# Patient Record
Sex: Male | Born: 1980 | Race: Black or African American | Hispanic: No | Marital: Single | Smoking: Former smoker
Health system: Southern US, Community
[De-identification: ages and names within clinical notes are randomized; demographics above are authoritative.]

## PROBLEM LIST (undated history)

## (undated) DIAGNOSIS — L0291 Cutaneous abscess, unspecified: Secondary | ICD-10-CM

## (undated) NOTE — ED Notes (Signed)
Formatting of this note might be different from the original.  Pt presents with abscess on R jawline and pain to area.  Pain score 5/10.  Sts Hx of cysts from ingrown hairs.    Electronically signed by Sondra Barges, RN at 02/25/2013 11:38 AM EDT

## (undated) NOTE — ED Notes (Signed)
Formatting of this note might be different from the original.  PA at bedside.  Electronically signed by Sondra Barges, RN at 02/25/2013 12:30 PM EDT

## (undated) NOTE — ED Provider Notes (Signed)
Formatting of this note might be different from the original.  Medical screening examination/treatment/procedure(s) were performed by non-physician practitioner and as supervising physician I was immediately available for consultation/collaboration.    Malvin Johns, MD  03/04/13 2337  Electronically signed by Malvin Johns, MD at 03/04/2013 11:37 PM EDT

## (undated) NOTE — ED Notes (Signed)
Formatting of this note might be different from the original.  Pt escorted to discharge window. Verbalized understanding discharge instructions. In no acute distress.     Electronically signed by Sondra Barges, RN at 02/25/2013 12:51 PM EDT

## (undated) NOTE — ED Provider Notes (Signed)
Formatting of this note is different from the original.  Images from the original note were not included.  History       CSN: [id number]    Arrival date & time 02/25/13  1120     First MD Initiated Contact with Patient 02/25/13 1138        Chief Complaint   Patient presents with   ? Abscess     (Consider location/radiation/quality/duration/timing/severity/associated sxs/prior  treatment)  HPI    Patient is a 65 year old male past medical history significant for skin abscesses presenting for right-sided facial abscess that he first noticed a day or 2 ago. Patient states he recently had his hair cut prior to abscess formation. Patient states in past this has been provoking factor for abscess development. Denies any surrounding erythema, fever, chills, drainage from site. No other physical complaints.    Past Medical History   Diagnosis Date   ? Abscess      History reviewed. No pertinent past surgical history.    History reviewed. No pertinent family history.    History   Substance Use Topics   ? Smoking status: Former Smoker   ? Smokeless tobacco: Not on file   ? Alcohol Use: Yes     Review of Systems   Constitutional: Negative for fever and chills.   HENT: Positive for facial swelling. Negative for neck pain.    Eyes: Negative for visual disturbance.   Respiratory: Negative for shortness of breath.    Cardiovascular: Negative for chest pain.   Skin:        Abscess     Allergies   Review of patient's allergies indicates no known allergies.    Home Medications     Current Outpatient Rx   Name  Route  Sig  Dispense  Refill   ? Multiple Vitamins-Minerals (MULTIVITAMINS THER. W/MINERALS) TABS    Oral    Take 1 tablet by mouth every morning.           BP 133/69  Pulse 80  Temp(Src) 98.9 F (37.2 C) (Oral)  Resp 16  SpO2 100%    Physical Exam   Constitutional: He is oriented to person, place, and time. He appears well-developed and well-nourished. No distress.   HENT:   Head: Normocephalic and atraumatic.     3 cm  fluctuant nonindurated abscess noted on right side of face. No surrounding erythema or warmth.   Eyes: Conjunctivae are normal.   Neck: Neck supple.   Neurological: He is alert and oriented to person, place, and time.   Skin: Skin is warm and dry. He is not diaphoretic.   Psychiatric: He has a normal mood and affect.     ED Course   Procedures (including critical care time)    INCISION AND DRAINAGE  Performed by: Baron Sane L  Consent: Verbal consent obtained.  Risks and benefits: risks, benefits and alternatives were discussed  Type: abscess    Body area: right side of face    Anesthesia: local infiltration    Incision was made with a scalpel.    Local anesthetic: lidocaine 1% w/o epinephrine    Anesthetic total: 3 ml    Complexity: complex  Blunt dissection to break up loculations    Drainage: purulent    Drainage amount: copious    Packing material: 1/4 in iodoform gauze    Patient tolerance: Patient tolerated the procedure well with no immediate complications.    Labs Reviewed - No data to display  No  results found.    1. Abscess      MDM   Patient with skin abscess amenable to incision and drainage.  Abscess was packed and drain,  wound recheck in 2 days. Encouraged home warm soaks and flushing.  Mild signs of cellulitis is surrounding skin.  Will d/c to home.  No antibiotic therapy is indicated.    Harlow Mares, PA-C  02/25/13 1605  Electronically signed by Malvin Johns, MD at 03/04/2013 11:37 PM EDT

---

## 2003-07-13 ENCOUNTER — Emergency Department (HOSPITAL_COMMUNITY): Admission: EM | Admit: 2003-07-13 | Discharge: 2003-07-14 | Payer: Self-pay | Admitting: Emergency Medicine

## 2005-02-26 ENCOUNTER — Emergency Department (HOSPITAL_COMMUNITY): Admission: EM | Admit: 2005-02-26 | Discharge: 2005-02-26 | Payer: Self-pay | Admitting: *Deleted

## 2005-10-12 IMAGING — CT CT ORBIT/TEMPORAL/IAC W/ CM
2 of 3 series · 15 of 40 positions shown, 18 images · IV contrast (100 ML OMNI 300)
Comparison: None.

CLINICAL DATA: Red, swollen rt eye for two weeks; no trauma
CT ORBITS WITH CONTRAST:
100 cc Omnipaque 300 IV.

[Series 102: orbits with contrast · axial · 0.34mm/px · z∈[-140,-55]mm · 12 of 151 slices shown, 15 images]
[im 7/151  brain]
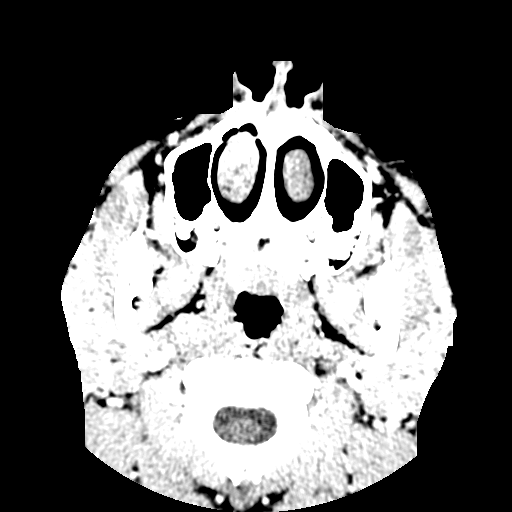
[im 7/151  bone]
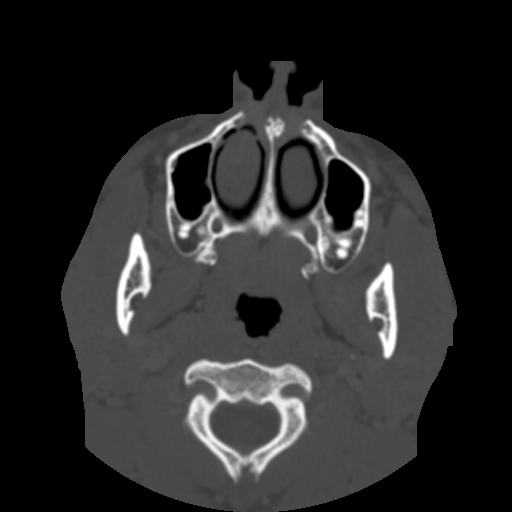
[im 20/151  bone]
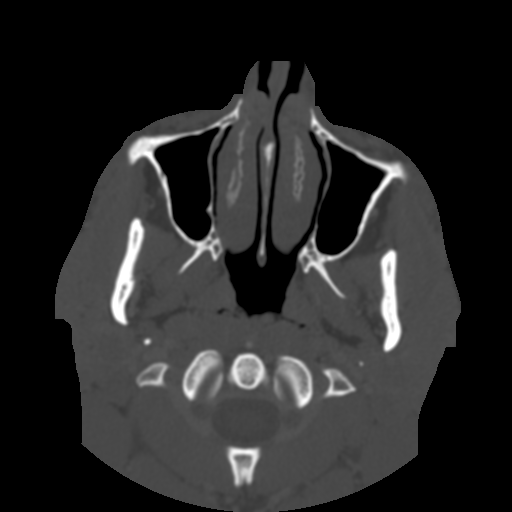
[im 33/151  bone]
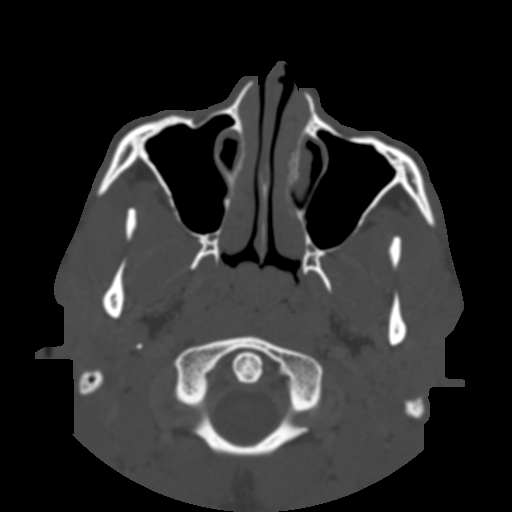
[im 46/151  bone]
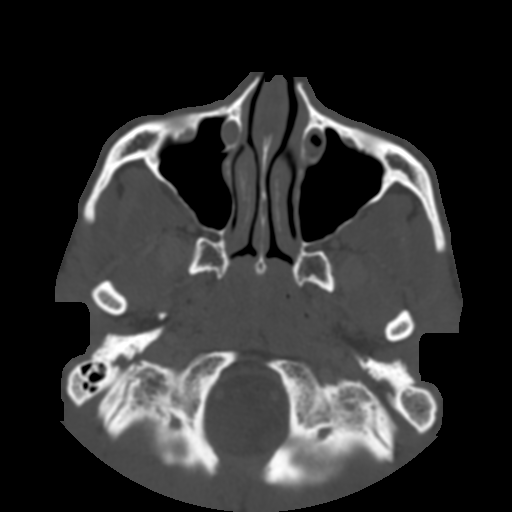
[im 59/151  brain]
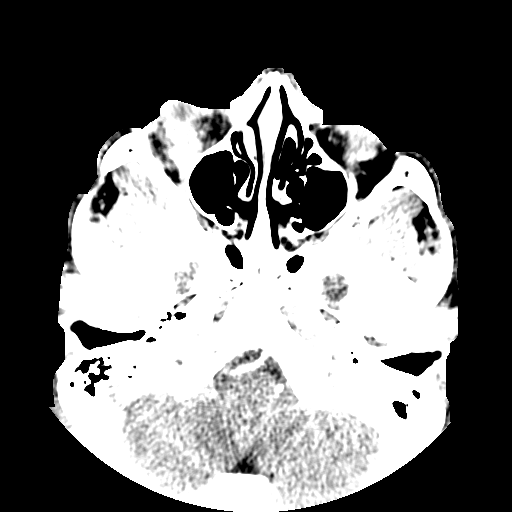
[im 59/151  bone]
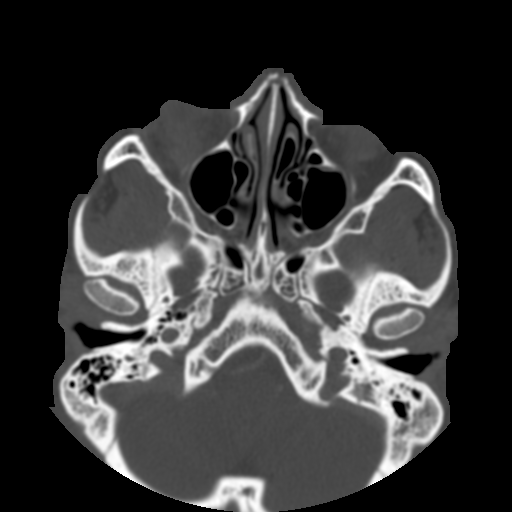
[im 72/151  bone]
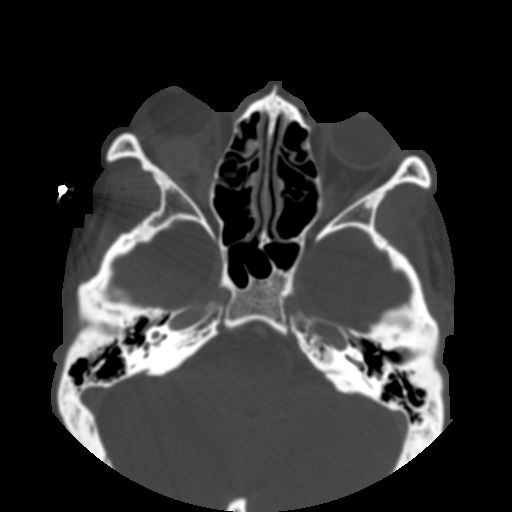
[im 79/151  bone]
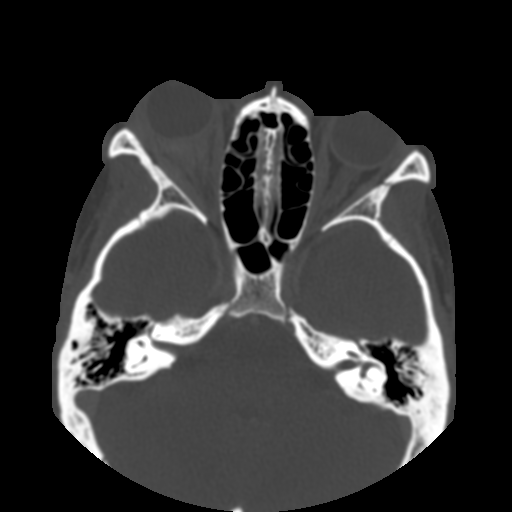
[im 92/151  bone]
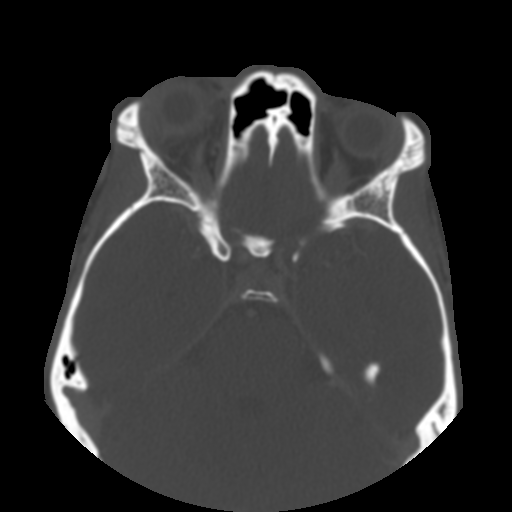
[im 105/151  brain]
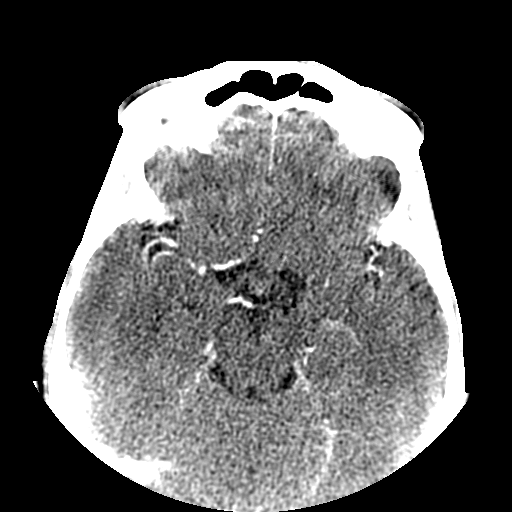
[im 105/151  bone]
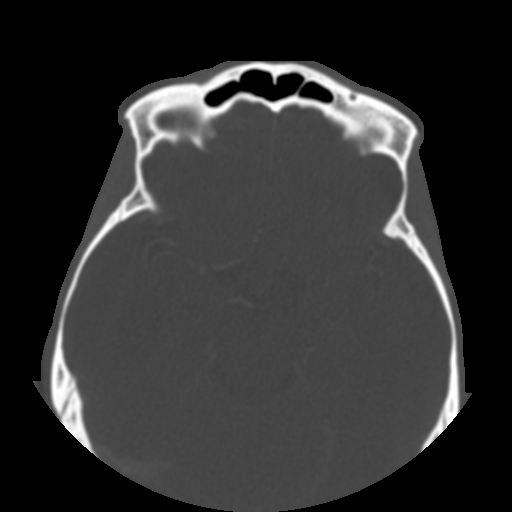
[im 118/151  bone]
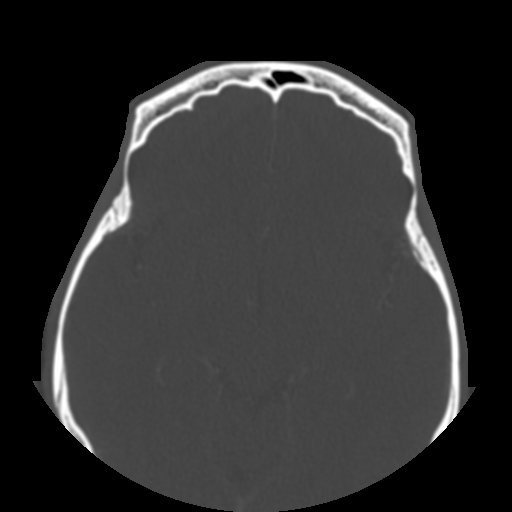
[im 131/151  bone]
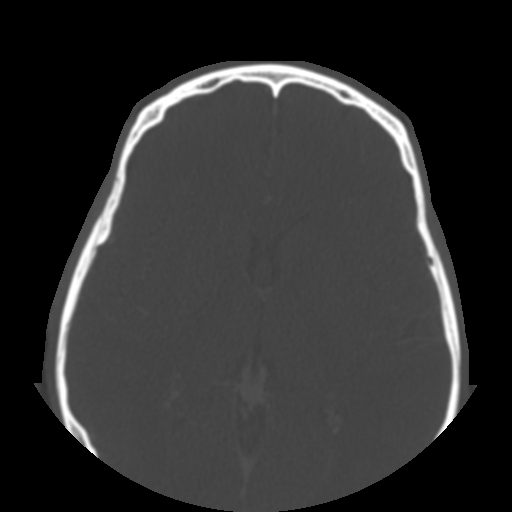
[im 144/151  bone]
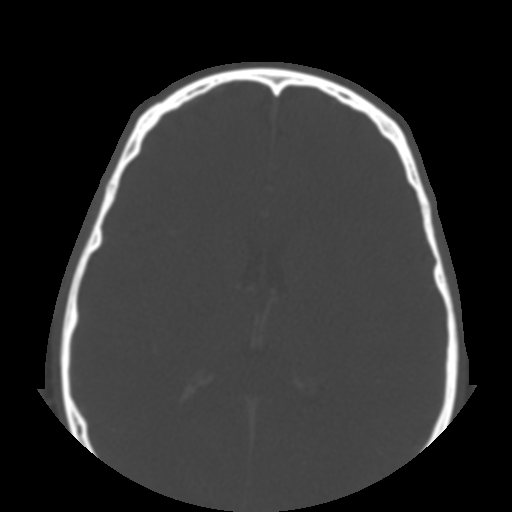

[Series 103: reformatted · coronal · 0.34mm/px · 3 of 55 slices shown]
[im 19/55  bone]
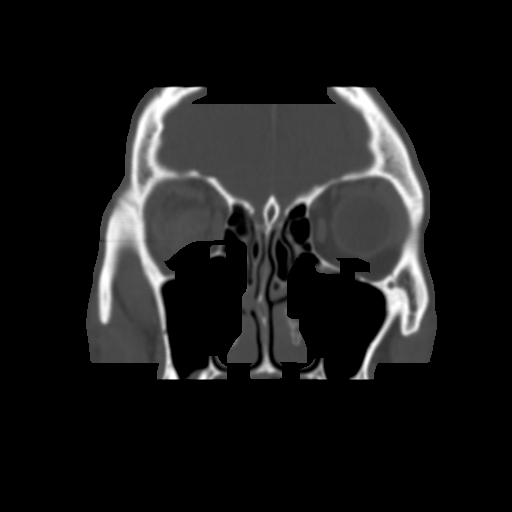
[im 25/55  bone]
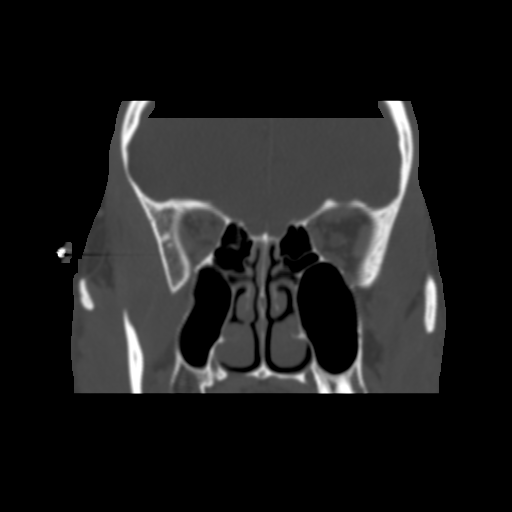
[im 31/55  bone]
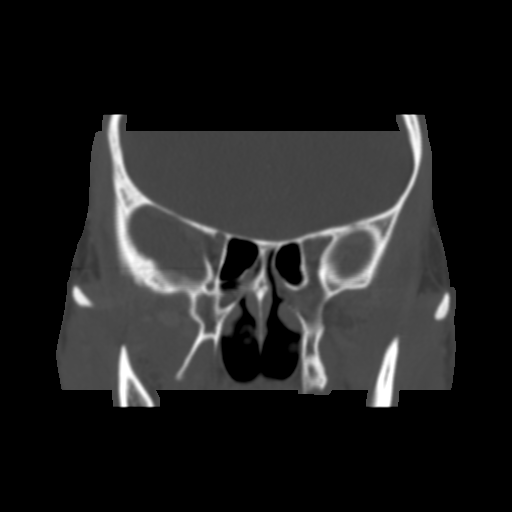

[15 of 40 positions shown; findings below may reference images not displayed]

There is moderate proptosis of the right eye.  There is an enhancing mass behind the right eye.  The mass has infiltrative and ill-defined margins and measures approximately 9 mm.  It idisplaces in the optic nerve medially.  The mass appears to be flattening the posterior sclera of the right eye, but does not appear to be invading the vitreous.  There is enhancement and thickening of tissues lateral to the right eye involving the lacrimal gland.  The right lateral rectus is enhancing and enlarged.  The remainder of the extraocular muscle proximally is normal in size.  No fluid collection is seen.  The eyelid is moderately swollen on the right.  Bones are normal and the paranasal sinuses are clear.  There is no evidence of sinusitis.  The left eye is normal.
IMPRESSION: Enhancing intraconal mass behind the right eye with some flattening of the posterior choroid.  There is infiltration into the intraconal fat and also infiltration of the lacrimal gland and right lateral rectus muscle.  While it is possible this is an area of orbital infection, there is no fluid collection and I think this is more likely a tumor.  Possibilities would include melanoma, lymphoma, metastatic disease.  Sarcoid would also be a possibility. 
The results were telephoned to Dr. Jg at 3199 hours on 02/26/05.

## 2010-06-08 ENCOUNTER — Emergency Department (HOSPITAL_COMMUNITY): Admission: EM | Admit: 2010-06-08 | Discharge: 2010-06-08 | Payer: Self-pay | Admitting: Emergency Medicine

## 2010-08-16 ENCOUNTER — Emergency Department (HOSPITAL_COMMUNITY): Admission: EM | Admit: 2010-08-16 | Discharge: 2010-08-16 | Payer: Self-pay | Admitting: Emergency Medicine

## 2011-08-23 ENCOUNTER — Emergency Department (HOSPITAL_COMMUNITY)
Admission: EM | Admit: 2011-08-23 | Discharge: 2011-08-23 | Disposition: A | Discharge status: Home / Self Care | Payer: BC Managed Care – PPO | Attending: Emergency Medicine | Admitting: Emergency Medicine

## 2011-08-23 DIAGNOSIS — R220 Localized swelling, mass and lump, head: Secondary | ICD-10-CM | POA: Insufficient documentation

## 2011-08-23 DIAGNOSIS — L0291 Cutaneous abscess, unspecified: Secondary | ICD-10-CM | POA: Insufficient documentation

## 2011-08-23 DIAGNOSIS — L0390 Cellulitis, unspecified: Secondary | ICD-10-CM | POA: Insufficient documentation

## 2011-08-23 HISTORY — DX: Cutaneous abscess, unspecified: L02.91

## 2011-08-23 MED ORDER — HYDROCODONE-ACETAMINOPHEN 5-325 MG PO TABS: 1.0000 | ORAL_TABLET | Freq: Four times a day (QID) | ORAL | Status: AC | PRN

## 2011-08-23 MED ORDER — CLINDAMYCIN HCL 150 MG PO CAPS: 300.0000 mg | ORAL_CAPSULE | Freq: Three times a day (TID) | ORAL | Status: AC

## 2011-08-23 MED ORDER — OXYCODONE-ACETAMINOPHEN 5-325 MG PO TABS
2.0000 | ORAL_TABLET | Freq: Once | ORAL | Status: DC
  Filled 2011-08-23: qty 2

## 2011-08-23 NOTE — ED Notes (Signed)
Abscess to rt side of face- Hx of the same

## 2011-08-23 NOTE — Discharge Instructions (Signed)
Follow up with an ENT or oral surgeon in 1 week. Take antibiotics as directed.   Abscess An abscess (boil or furuncle) is an infected area that contains a collection of pus.  SYMPTOMS Signs and symptoms of an abscess include pain, tenderness, redness, or hardness. You may feel a moveable soft area under your skin. An abscess can occur anywhere in the body.  TREATMENT  A surgical cut (incision) may be made over your abscess to drain the pus. Gauze may be packed into the space or a drain may be looped through the abscess cavity (pocket). This provides a drain that will allow the cavity to heal from the inside outwards. The abscess may be painful for a few days, but should feel much better if it was drained.  Your abscess, if seen early, may not have localized and may not have been drained. If not, another appointment may be required if it does not get better on its own or with medications. HOME CARE INSTRUCTIONS   Only take over-the-counter or prescription medicines for pain, discomfort, or fever as directed by your caregiver.   Take your antibiotics as directed if they were prescribed. Finish them even if you start to feel better.   Keep the skin and clothes clean around your abscess.   If the abscess was drained, you will need to use gauze dressing to collect any draining pus. Dressings will typically need to be changed 3 or more times a day.   The infection may spread by skin contact with others. Avoid skin contact as much as possible.   Practice good hygiene. This includes regular hand washing, cover any draining skin lesions, and do not share personal care items.   If you participate in sports, do not share athletic equipment, towels, whirlpools, or personal care items. Shower after every practice or tournament.   If a draining area cannot be adequately covered:   Do not participate in sports.   Children should not participate in day care until the wound has healed or drainage stops.     If your caregiver has given you a follow-up appointment, it is very important to keep that appointment. Not keeping the appointment could result in a much worse infection, chronic or permanent injury, pain, and disability. If there is any problem keeping the appointment, you must call back to this facility for assistance.  SEEK MEDICAL CARE IF:   You develop increased pain, swelling, redness, drainage, or bleeding in the wound site.   You develop signs of generalized infection including muscle aches, chills, fever, or a general ill feeling.   You have an oral temperature above 102 F (38.9 C).  MAKE SURE YOU:   Understand these instructions.   Will watch your condition.   Will get help right away if you are not doing well or get worse.  Document Released: 06/29/2005 Document Revised: 06/01/2011 Document Reviewed: 04/22/2008 [facility] Patient Information 2012 [place], [place].

## 2011-08-23 NOTE — ED Provider Notes (Signed)
History     CSN: [id number] Arrival date & time: 08/23/2011 10:20 AM   First MD Initiated Contact with Patient 08/23/11 1046      Chief Complaint  Patient presents with  . Abscess   Patient is a 30 y.o. male presenting with abscess.  Abscess  This is a recurrent problem. The abscess is present on the face. The abscess is characterized by painfulness. Pertinent negatives include no fever, no vomiting, no rhinorrhea, no sore throat and no cough.   Patient presents to the emergency room with complaint of abscess to the right face. Reports that was seen for this problem 6 months prior. States that he plans to see a Careers adviser for this issue.  Past Medical History  Diagnosis Date  . Abscess     History reviewed. No pertinent past surgical history.  History reviewed. No pertinent family history.  History  Substance Use Topics  . Smoking status: Current Everyday Smoker  . Smokeless tobacco: Not on file  . Alcohol Use: Yes      Review of Systems  Constitutional: Negative for fever, chills, diaphoresis and appetite change.  HENT: Negative for sore throat, rhinorrhea, neck pain, neck stiffness and tinnitus.   Eyes: Negative for photophobia and visual disturbance.  Respiratory: Negative for cough, chest tightness and shortness of breath.   Cardiovascular: Negative for chest pain.  Gastrointestinal: Negative for nausea, vomiting and abdominal pain.  Genitourinary: Negative for flank pain.  Musculoskeletal: Negative for back pain.  Skin: Negative for rash.  Neurological: Negative for weakness and numbness.  All other systems reviewed and are negative.    Allergies  Review of patient's allergies indicates no known allergies.  Home Medications   Current Outpatient Rx  Name Route Sig Dispense Refill  . IBUPROFEN 200 MG PO TABS Oral Take 200 mg by mouth every 6 (six) hours as needed. pain     . THERA M PLUS PO TABS Oral Take 1 tablet by mouth daily.        BP 143/85  Pulse  64  Temp(Src) 98.4 F (36.9 C) (Oral)  Resp 20  Wt 165 lb (74.844 kg)  SpO2 100%  Physical Exam  Vitals reviewed. Constitutional: He is oriented to person, place, and time. He appears well-developed and well-nourished.  HENT:  Head: Normocephalic and atraumatic.  Right Ear: External ear normal.  Mouth/Throat: Oropharynx is clear and moist.       Fluctuant mass 2 x 2 cm fluctuant mass, superficial, near angle of the right mandible. No overlying erythema or warmth. No purulent discharge.   Eyes: EOM are normal. Pupils are equal, round, and reactive to light. Right eye exhibits no discharge. Left eye exhibits no discharge.  Neck: Normal range of motion. Neck supple.  Cardiovascular: Normal rate and regular rhythm.   Pulmonary/Chest: Effort normal and breath sounds normal.  Musculoskeletal: He exhibits no edema and no tenderness.  Neurological: He is alert and oriented to person, place, and time. He displays normal reflexes. No cranial nerve deficit. He exhibits normal muscle tone. Coordination normal.  Skin: Skin is warm and dry. No rash noted. No erythema. No pallor.  Psychiatric: He has a normal mood and affect. His behavior is normal. Judgment and thought content normal.    ED Course  Procedures (including critical care time)  INCISION AND DRAINAGE Performed by: Rochel Brome A Consent: Verbal consent obtained. Risks and benefits: risks, benefits and alternatives were discussed Type: abscess  Body area:  Right face  Anesthesia: local infiltration  Local anesthetic: lidocaine 2% without epinephrine  Anesthetic total: 4 ml  Complexity: complex Blunt dissection to break up loculations  Drainage: purulent. Copious amounts   Drainage amount: 3  Patient tolerance: Patient tolerated the procedure well with no immediate complications.  12:32 PM patient tolerated procedure without problems. Bleeding controlled. Pain improved. Stressed the importance of having the patient  follow up with a surgeon for further evaluation and treatment. Advised of warning signs to return. Discussed importance of taking antibiotics as directed.    MDM  Abscess of right face        Demetrius Charity, PA 08/23/11 1234

## 2011-08-23 NOTE — ED Notes (Signed)
Large band aid intact to rt side of face applied by EDPA jennifer

## 2011-08-23 NOTE — ED Provider Notes (Signed)
Medical screening examination/treatment/procedure(s) were performed by non-physician practitioner and as supervising physician I was immediately available for consultation/collaboration.   Glynn Octave, MD 08/23/11 [phone]

## 2013-02-25 ENCOUNTER — Emergency Department (HOSPITAL_COMMUNITY)
Admission: EM | Admit: 2013-02-25 | Discharge: 2013-02-25 | Disposition: A | Discharge status: Home / Self Care | Payer: BC Managed Care – PPO | Attending: Emergency Medicine | Admitting: Emergency Medicine

## 2013-02-25 ENCOUNTER — Encounter (HOSPITAL_COMMUNITY): Payer: Self-pay

## 2013-02-25 DIAGNOSIS — R221 Localized swelling, mass and lump, neck: Secondary | ICD-10-CM | POA: Insufficient documentation

## 2013-02-25 DIAGNOSIS — L03211 Cellulitis of face: Secondary | ICD-10-CM | POA: Insufficient documentation

## 2013-02-25 DIAGNOSIS — L0201 Cutaneous abscess of face: Secondary | ICD-10-CM | POA: Insufficient documentation

## 2013-02-25 DIAGNOSIS — Z87891 Personal history of nicotine dependence: Secondary | ICD-10-CM | POA: Insufficient documentation

## 2013-02-25 DIAGNOSIS — R220 Localized swelling, mass and lump, head: Secondary | ICD-10-CM | POA: Insufficient documentation

## 2013-02-25 DIAGNOSIS — Z79899 Other long term (current) drug therapy: Secondary | ICD-10-CM | POA: Insufficient documentation

## 2013-02-25 DIAGNOSIS — L0291 Cutaneous abscess, unspecified: Secondary | ICD-10-CM

## 2013-02-25 NOTE — ED Notes (Addendum)
Pt presents with abscess on R jawline and pain to area.  Pain score 5/10.  Sts Hx of cysts from ingrown hairs.

## 2013-02-25 NOTE — ED Notes (Signed)
Pt escorted to discharge window. Verbalized understanding discharge instructions. In no acute distress.   

## 2013-02-25 NOTE — ED Notes (Signed)
PA at bedside.

## 2013-02-25 NOTE — ED Provider Notes (Signed)
History     CSN: [id number]  Arrival date & time 02/25/13  1120   First MD Initiated Contact with Patient 02/25/13 1138      Chief Complaint  Patient presents with  . Abscess    (Consider location/radiation/quality/duration/timing/severity/associated sxs/prior treatment) HPI  Patient is a 32 year old male past medical history significant for skin abscesses presenting for right-sided facial abscess that he first noticed a day or 2 ago. Patient states he recently had his hair cut prior to abscess formation. Patient states in past this has been provoking factor for abscess development. Denies any surrounding erythema, fever, chills, drainage from site. No other physical complaints.  Past Medical History  Diagnosis Date  . Abscess     History reviewed. No pertinent past surgical history.  History reviewed. No pertinent family history.  History  Substance Use Topics  . Smoking status: Former Games developer  . Smokeless tobacco: Not on file  . Alcohol Use: Yes      Review of Systems  Constitutional: Negative for fever and chills.  HENT: Positive for facial swelling. Negative for neck pain.   Eyes: Negative for visual disturbance.  Respiratory: Negative for shortness of breath.   Cardiovascular: Negative for chest pain.  Skin:       Abscess    Allergies  Review of patient's allergies indicates no known allergies.  Home Medications   Current Outpatient Rx  Name  Route  Sig  Dispense  Refill  . Multiple Vitamins-Minerals (MULTIVITAMINS THER. W/MINERALS) TABS   Oral   Take 1 tablet by mouth every morning.            BP 133/69  Pulse 80  Temp(Src) 98.9 F (37.2 C) (Oral)  Resp 16  SpO2 100%  Physical Exam  Constitutional: He is oriented to person, place, and time. He appears well-developed and well-nourished. No distress.  HENT:  Head: Normocephalic and atraumatic.    3 cm fluctuant nonindurated abscess noted on right side of face. No surrounding erythema or  warmth.  Eyes: Conjunctivae are normal.  Neck: Neck supple.  Neurological: He is alert and oriented to person, place, and time.  Skin: Skin is warm and dry. He is not diaphoretic.  Psychiatric: He has a normal mood and affect.    ED Course  Procedures (including critical care time)  INCISION AND DRAINAGE Performed by: Francee Piccolo L Consent: Verbal consent obtained. Risks and benefits: risks, benefits and alternatives were discussed Type: abscess  Body area: right side of face  Anesthesia: local infiltration  Incision was made with a scalpel.  Local anesthetic: lidocaine 1% w/o epinephrine  Anesthetic total: 3 ml  Complexity: complex Blunt dissection to break up loculations  Drainage: purulent  Drainage amount: copious  Packing material: 1/4 in iodoform gauze  Patient tolerance: Patient tolerated the procedure well with no immediate complications.     Labs Reviewed - No data to display No results found.   1. Abscess       MDM  Patient with skin abscess amenable to incision and drainage.  Abscess was packed and drain,  wound recheck in 2 days. Encouraged home warm soaks and flushing.  Mild signs of cellulitis is surrounding skin.  Will d/c to home.  No antibiotic therapy is indicated.         Jeannetta Ellis, PA-C 02/25/13 1605

## 2013-03-04 NOTE — ED Provider Notes (Signed)
Medical screening examination/treatment/procedure(s) were performed by non-physician practitioner and as supervising physician I was immediately available for consultation/collaboration.   Rolan Bucco, MD 03/04/13 2337

## 2022-11-12 ENCOUNTER — Emergency Department (HOSPITAL_COMMUNITY): Payer: BC Managed Care – PPO

## 2022-11-12 ENCOUNTER — Emergency Department
Admission: EM | Admit: 2022-11-12 | Discharge: 2022-11-12 | Disposition: A | Discharge status: Home / Self Care | Payer: BC Managed Care – PPO | Attending: Family | Admitting: Family

## 2022-11-12 ENCOUNTER — Other Ambulatory Visit: Payer: Self-pay

## 2022-11-12 ENCOUNTER — Emergency Department (HOSPITAL_COMMUNITY): Payer: BLUE CROSS/BLUE SHIELD

## 2022-11-12 DIAGNOSIS — H539 Unspecified visual disturbance: Secondary | ICD-10-CM

## 2022-11-12 DIAGNOSIS — G43109 Migraine with aura, not intractable, without status migrainosus: Secondary | ICD-10-CM | POA: Insufficient documentation

## 2022-11-12 LAB — CBC WITH DIFF
BASOPHIL #: <0.1 10*3/uL (ref <=0.20)
BASOPHIL %: 1 %
EOSINOPHIL #: <0.1 10*3/uL (ref <=0.50)
EOSINOPHIL %: 1 %
HCT: 50.7 % (ref 38.9–52.0)
HGB: 17.2 g/dL (ref 13.4–17.5)
IMMATURE GRANULOCYTE #: <0.1 10*3/uL (ref <0.10)
IMMATURE GRANULOCYTE %: 0 % (ref 0.0–1.0)
LYMPHOCYTE #: 2.17 10*3/uL (ref 1.00–4.80)
LYMPHOCYTE %: 43 %
MCH: 30 pg (ref 26.0–32.0)
MCHC: 33.9 g/dL (ref 31.0–35.5)
MCV: 88.5 fL (ref 78.0–100.0)
MONOCYTE #: 0.42 10*3/uL (ref 0.20–1.10)
MONOCYTE %: 8 %
MPV: 9.3 fL (ref 8.7–12.5)
NEUTROPHIL #: 2.34 10*3/uL (ref 1.50–7.70)
NEUTROPHIL %: 47 %
PLATELETS: 239 10*3/uL (ref 150–400)
RBC: 5.73 10*6/uL (ref 4.50–6.10)
RDW-CV: 14.5 % (ref 11.5–15.5)
WBC: 5 10*3/uL (ref 3.7–11.0)

## 2022-11-12 LAB — BASIC METABOLIC PANEL
ANION GAP: 8 mmol/L (ref 4–13)
BUN/CREA RATIO: 9 (ref 6–22)
BUN: 8 mg/dL (ref 8–25)
CALCIUM: 9.7 mg/dL (ref 8.6–10.2)
CHLORIDE: 107 mmol/L (ref 96–111)
CO2 TOTAL: 24 mmol/L (ref 22–30)
CREATININE: 0.92 mg/dL (ref 0.75–1.35)
ESTIMATED GFR - MALE: >90 mL/min/BSA (ref >=60)
GLUCOSE: 102 mg/dL (ref 65–125)
POTASSIUM: 4 mmol/L (ref 3.5–5.1)
SODIUM: 139 mmol/L (ref 136–145)

## 2022-11-12 MED ORDER — METHYLPREDNISOLONE 4 MG TABLETS IN A DOSE PACK
ORAL_TABLET | ORAL | 0 refills | Status: AC
Start: 2022-11-12 — End: ?

## 2022-11-12 NOTE — ED Provider Notes (Signed)
Department of Emergency [place] [facility]  HPI - 11/12/2022      Advanced Practice Provider: Beatris Si, FNP-BC  Attending Physician: Dr. Adelene Idler    Chief Complaint: Double vision    HPI  Jeremy Russell is a 42 y.o. male who presents to the ED via POV with c/o double vision. Patient reports pressure behind his eyes for the past 2-3 weeks that is similar to a headache but not painful. He denies hx of headaches. Patient reports double vision when looking with both eyes but can see clearly if he closes either eye. He states he has to strain to be able to focus both of his eyes. Patient reports he went to a walk-in eye clinic and was told his eyes are structurally normal but he may need a progressive lens. When takes ibuprofen  symptoms improve. He states he has not been seen by a PCP or had blood work done in a while. He denies dizziness, nausea, vomiting, fevers, chills, and any injuries.     The patient denies any other complaints or concerns at this time.    History Limitations: None.     Review of Systems  Constitutional: No fever, chills or weakness   Skin: No rash or diaphoresis  HENT: No headaches or congestion +Head pressure  Eyes: +Double vision  Cardio: No chest pain, palpitations or leg swelling   Respiratory: No cough, wheezing or SOB  GI:  No abdominal pain, nausea, vomiting or stool changes  GU:  No urinary changes  MSK: No joint or back pain  Neuro: No seizures, dizziness, or LOC  All other systems reviewed and are negative.    History:   PMH:  No past medical history on file.    PSH:  No past surgical history pertinent negatives on file.      Social Hx:    Social History     Socioeconomic History    Marital status: Single     Spouse name: Not on file    Number of children: Not on file    Years of education: Not on file    Highest education level: Not on file   Occupational History    Not on file   Tobacco Use    Smoking status: Not on file    Smokeless tobacco: Not on file   Substance and  Sexual Activity    Alcohol use: Not on file    Drug use: Not on file    Sexual activity: Not on file   Other Topics Concern    Not on file   Social History Narrative    Not on file     Social Determinants of Health     Financial Resource Strain: Not on file   Transportation Needs: Not on file   Social Connections: Not on file   Intimate Partner Violence: Not on file   Housing Stability: Not on file     Family Hx: No family history on file.  Allergies: No Known Allergies  Medications:   None       Above history reviewed with patient, changes are as documented.    Physical Exam   Nursing notes reviewed.    Filed Vitals:    11/12/22 1052   BP: 130/77   Pulse: 77   Resp: 15   Temp: 36.6 C (97.8 F)   SpO2: 99%      Constitutional: NAD. Oriented.  HENT:   Head: Normocephalic and atraumatic.   Mouth/Throat: Oropharynx  is clear and moist.   Eyes: EOMI, PERRLA.   Neck: Trachea midline. Neck supple. No lymphadenopathy.   Cardiovascular: Regular rate and rhythm. No murmurs, rubs or gallops. Intact distal pulses.  Pulmonary/Chest: Normal work of breathing, no respiratory distress. CTAB without wheezing, rhonchi, crackles  GI: BS +. Abdomen soft, non distended. No tenderness, rebound, or guarding.      Musculoskeletal: No edema, tenderness, or deformity.  Skin: Warm and dry. No pallor or cyanosis. No rash or erythema.  Psychiatric: Normal mood and affect. Behavior is normal.   Neurological: Alert and keenly responsive. Facies symmetric. Able to raise eyebrows, close eyes, smile, puff mouth, stick out tongue, move tongue left and right and raise palate symmetrically. Able to shrug shoulders. PERRLA. SILT to forehead below eye and at jawline. Can hear soft noise bilaterally. Strength full and equal bilaterally in upper and lower extremities. Good finger to nose, rapid alternating movement.            Course  Orders, Abnormal Labs and Imaging Results:  Results up to the Time the Disposition was Entered   BASIC METABOLIC PANEL -  Normal   CBC/DIFF    Narrative:     The following orders were created for panel order CBC/DIFF.  Procedure                               Abnormality         Status                     ---------                               -----------         ------                     CBC WITH [id number]                                    Final result                 Please view results for these tests on the individual orders.   CBC WITH DIFF   CT BRAIN WO IV CONTRAST    Narrative:     Kali Baumler    PROCEDURE DESCRIPTION: CT BRAIN WO IV CONTRAST.   PROCEDURE PERFORMED DATE AND TIME: 11/12/2022 2:37 PM.   CLINICAL INDICATION: headaches, vision changes.   COMPARISON: No prior studies were compared.     FINDINGS:  No intracranial hemorrhage.  No intracranial mass or mass effect.  Size and configuration of ventricles is within normal range.  The gray-white interface appears preserved.  No acute bony abnormality is appreciated.  Visualized paranasal sinuses and mastoid air cells appear appropriately pneumatized.  There is probable benign lipoma in the subcutaneous soft tissue of the RIGHT suboccipital scalp (image 8/2).             Medical Decision Making  Problems Addressed:  Changes in vision: acute illness or injury  Ocular migraine: acute illness or injury    Amount and/or Complexity of Data Reviewed  Labs: ordered.  Radiology: ordered. Decision-making details documented in ED Course.    Risk  Prescription drug management.  Consults: none indicated  Impression:   Clinical Impression   Changes in vision (Primary)   Ocular migraine     Disposition:     Discharged    Discharge: Following the above history, physical exam, and studies, the patient was deemed stable and suitable for discharge. Patient was discharged with Medrol Dosepak and follow up with PCP. Discharge medications and follow-up instructions were discussed with patient/patient's family. It was advised that the patient return to the ED if they develop new or  any other concerning symptoms. The patient/patient's family verbalized understanding of all instructions and had no further questions or concerns.    Follow Up:   your doctor    Schedule an appointment as soon as possible for a visit         Prescriptions:   Discharge Medication List as of 11/12/2022  2:56 PM        START taking these medications    Details   Methylprednisolone (MEDROL DOSEPACK) 4 mg Oral Tablets, Dose Pack Take as instructed., Disp-21 Tablet, R-0, E-Rx               No future appointments.    The co-signing faculty was physically present in the emergency department and available for consultation and did not participate in the care of this patient.    I am scribing for, and in the presence of, Beatris Si, [facility], for services provided on 11/12/2022  Cindra Presume, SCRIBE    // Cindra Presume, [place]  11/12/2022, 12:15    I personally performed the services described in this documentation, as scribed  in my presence, and it is both accurate  and complete.    Beatris Si, APRN,FNP-BC          *Parts of this patients chart were completed in a retrospective fashion due to simultaneous direct patient care activities in the Emergency Department.   *This note was partially generated using MModal Fluency Direct system, and there may be some incorrect words, spellings, and punctuation that were not noted in checking the note before saving.

## 2022-11-12 NOTE — ED Nurses Note (Signed)
Patient discharged home with family.  AVS reviewed with patient/care giver.  A written copy of the AVS and discharge instructions was given to the patient/care giver.  Questions sufficiently answered as needed.  Patient/care giver encouraged to follow up with PCP as indicated.  In the event of an emergency, patient/care giver instructed to call 911 or go to the nearest emergency room.

## 2022-12-16 ENCOUNTER — Ambulatory Visit
Admission: EM | Admit: 2022-12-16 | Discharge: 2022-12-16 | Disposition: A | Discharge status: Home / Self Care | Payer: BC Managed Care – PPO

## 2022-12-16 DIAGNOSIS — H5711 Ocular pain, right eye: Secondary | ICD-10-CM | POA: Diagnosis not present

## 2022-12-16 MED ORDER — PREDNISONE 10 MG (21) PO TBPK: ORAL_TABLET | Freq: Every day | ORAL | 0 refills | Status: AC

## 2022-12-16 MED ORDER — SUMATRIPTAN SUCCINATE 6 MG/0.5ML ~~LOC~~ SOLN
6.0000 mg | Freq: Once | SUBCUTANEOUS | Status: AC
  Administered 2022-12-16: 6 mg via SUBCUTANEOUS

## 2022-12-16 MED ORDER — DEXAMETHASONE SODIUM PHOSPHATE 10 MG/ML IJ SOLN
10.0000 mg | Freq: Once | INTRAMUSCULAR | Status: AC
  Administered 2022-12-16: 10 mg via INTRAMUSCULAR

## 2022-12-16 MED ORDER — KETOROLAC TROMETHAMINE 30 MG/ML IJ SOLN
30.0000 mg | Freq: Once | INTRAMUSCULAR | Status: AC
  Administered 2022-12-16: 30 mg via INTRAMUSCULAR

## 2022-12-16 NOTE — ED Triage Notes (Signed)
Pt states he started having headaches and double vision that started a month and half ago. Went to ER and eye doctor to get seen, CT scan was good, and was prescribed prednisone and states it helped but once finished with the course the pressure in eyes and headache comes back. Finished prednisone 2 days ago. Pt states tylenol and ibuprofen doesn't help.

## 2022-12-16 NOTE — ED Provider Notes (Signed)
William Patterson    CSN: [id number] Arrival date & time: 12/16/22  0847      History   Chief Complaint Chief Complaint  Patient presents with   Headache    HPI William Patterson is a 42 y.o. male.   Patient presents for evaluation of right eye pressure beginning 1-1/2 months ago.  Initially accompanied with double vision that worsened when attempting to focus.  Was initially evaluated optometrist and walk-in eye clinic, exam normal eye pressure normal.  Was given a 5-day course of prednisone did improve but continued to persist and therefore he followed up at an emergency department where CT imaging and blood work was completed, all normal, given an additional course of prednisone which she completed 2 days ago.  Double vision has resolved blood pressure has persisted.  Denies blurred vision, light sensitivity, memory or speech changes, headache, weakness, dizziness or lightheaded.  The symptoms have never occurred before.  Recently began wearing the last 3 to 4 months ago.  Past Medical History:  Diagnosis Date   Abscess     There are no problems to display for this patient.   History reviewed. No pertinent surgical history.     Home Medications    Prior to Admission medications   Medication Sig Start Date End Date Taking? Authorizing Provider  Multiple Vitamins-Minerals (MULTIVITAMINS THER. W/MINERALS) TABS Take 1 tablet by mouth every morning.    Yes [provider]  predniSONE (STERAPRED UNI-PAK 21 TAB) 10 MG (21) TBPK tablet Take by mouth daily. Take 6 tabs by mouth daily  for 2 days, then 5 tabs for 2 days, then 4 tabs for 2 days, then 3 tabs for 2 days, 2 tabs for 2 days, then 1 tab by mouth daily for 2 days 12/16/22  Yes Hans Eden, NP    Family History History reviewed. No pertinent family history.  Social History Social History   Tobacco Use   Smoking status: Former  Substance Use Topics   Alcohol use: Yes   Drug use: No     Allergies    Patient has no known allergies.   Review of Systems Review of Systems  Constitutional: Negative.   HENT: Negative.    Eyes:  Positive for pain. Negative for photophobia, discharge, redness, itching and visual disturbance.  Respiratory: Negative.    Cardiovascular: Negative.   Neurological: Negative.      Physical Exam Triage Vital Signs ED Triage Vitals [12/16/22 0903]  Enc Vitals Group     BP 131/80     Pulse Rate 88     Resp 16     Temp 98.6 F (37 C)     Temp Source Oral     SpO2 95 %     Weight      Height      Head Circumference      Peak Flow      Pain Score 3     Pain Loc      Pain Edu?      Excl. in [place]?    No data found.  Updated Vital Signs BP 131/80 (BP Location: Right Arm)   Pulse 88   Temp 98.6 F (37 C) (Oral)   Resp 16   SpO2 95%   Visual Acuity Right Eye Distance:   Left Eye Distance:   Bilateral Distance:    Right Eye Near:   Left Eye Near:    Bilateral Near:     Physical Exam Constitutional:  Appearance: Normal appearance. He is well-developed.  Eyes:     Extraocular Movements: Extraocular movements intact.     Conjunctiva/sclera: Conjunctivae normal.     Pupils: Pupils are equal, round, and reactive to light.  Pulmonary:     Effort: Pulmonary effort is normal.  Neurological:     General: No focal deficit present.     Mental Status: He is alert and oriented to person, place, and time. Mental status is at baseline.     Cranial Nerves: No cranial nerve deficit.     Motor: No weakness.     Gait: Gait normal.      UC Treatments / Results  Labs (all labs ordered are listed, but only abnormal results are displayed) Labs Reviewed - No data to display  EKG   Radiology No results found.  Procedures Procedures (including critical care time)  Medications Ordered in UC Medications  dexamethasone (DECADRON) injection 10 mg (has no administration in time range)  SUMAtriptan (IMITREX) injection 6 mg (has no  administration in time range)  ketorolac (TORADOL) 30 MG/ML injection 30 mg (has no administration in time range)    Initial Impression / Assessment and Plan / UC Course  I have reviewed the triage vital signs and the nursing notes.  Pertinent labs & imaging results that were available during my care of the patient were reviewed by me and considered in my medical decision making (see chart for details).  Right eye ocular pain  No neurological abnormalities, no overt abnormalities to the eye, stable at this time, given injection of Toradol, Decadron and Imitrex in an attempt to help reduce pressure, prednisone taper prescribed as it has been helpful, given walking referral to neurology and ophthalmology for further management and further evaluation, given strict precautions at any point if symptoms worsen in severity or new symptoms begin he is to return to the nearest emergency department for reevaluation Final Clinical Impressions(s) / UC Diagnoses   Final diagnoses:  Ocular pain, right eye     Discharge Instructions      On exam there are no neurological abnormalities that would indicate a more serious cause.  At this time  You have been given injections of Decadron, Toradol and Imitrex in efforts to help reduce your eye pressure and give you some relief, ideally things will start to turn down in approximately 30 minutes  Starting tomorrow begin prednisone as directed to help reduce pressure, you may take Tylenol 500 to 1000 mg of this medicine  To determine cause of your eye pressure you will need further worku  Please schedule an appointment with neurology for further evaluation and management of your symptoms  Please schedule follow-up appointment with ophthalmology for reevaluation and further management of your symptoms  At any point if your symptoms worsen in severity or you begin to have new symptoms please nearest emergency department for immediate evaluation   ED  Prescriptions     Medication Sig Dispense Auth. Provider   predniSONE (STERAPRED UNI-PAK 21 TAB) 10 MG (21) TBPK tablet Take by mouth daily. Take 6 tabs by mouth daily  for 2 days, then 5 tabs for 2 days, then 4 tabs for 2 days, then 3 tabs for 2 days, 2 tabs for 2 days, then 1 tab by mouth daily for 2 days 42 tablet Ritesh Opara, Leitha Schuller, NP      PDMP not reviewed this encounter.   Hans Eden, [place] 12/16/22 [phone]

## 2022-12-16 NOTE — Discharge Instructions (Addendum)
On exam there are no neurological abnormalities that would indicate a more serious cause.  At this time  You have been given injections of Decadron, Toradol and Imitrex in efforts to help reduce your eye pressure and give you some relief, ideally things will start to turn down in approximately 30 minutes  Starting tomorrow begin prednisone as directed to help reduce pressure, you may take Tylenol 500 to 1000 mg of this medicine  To determine cause of your eye pressure you will need further worku  Please schedule an appointment with neurology for further evaluation and management of your symptoms  Please schedule follow-up appointment with ophthalmology for reevaluation and further management of your symptoms  At any point if your symptoms worsen in severity or you begin to have new symptoms please nearest emergency department for immediate evaluation

## 2023-02-07 ENCOUNTER — Ambulatory Visit
Admission: EM | Admit: 2023-02-07 | Discharge: 2023-02-07 | Disposition: A | Discharge status: Home / Self Care | Payer: BC Managed Care – PPO

## 2023-02-07 DIAGNOSIS — H5711 Ocular pain, right eye: Secondary | ICD-10-CM

## 2023-02-07 NOTE — ED Provider Notes (Signed)
Renaldo Fiddler    CSN: [id number] Arrival date & time: 02/07/23  1743      History   Chief Complaint Chief Complaint  Patient presents with   Eye Pain    HPI William Patterson is a 42 y.o. male.    Eye Pain    Patient presents to urgent care with pain and pressure in his right eye.  He states he was seen here on 3/15 and followed up with an eye doctor.  He was prescribed steroid eyedrops but states treatment was not improving his symptoms.  He was seen on 4/16 by an optometrist and prescribed another course of prednisone as well as prednisone eyedrops.  He was referred to ophthalmologist and he has an appointment in 7 days.  He states he cannot wait until then and is requesting treatment here today.  Past Medical History:  Diagnosis Date   Abscess     There are no problems to display for this patient.   History reviewed. No pertinent surgical history.     Home Medications    Prior to Admission medications   Medication Sig Start Date End Date Taking? Authorizing Provider  Multiple Vitamins-Minerals (MULTIVITAMINS THER. W/MINERALS) TABS Take 1 tablet by mouth every morning.    Yes [provider]  prednisoLONE acetate (PRED FORTE) 1 % ophthalmic suspension 1 drop 3 (three) times daily. 01/30/23  Yes [provider]  predniSONE (STERAPRED UNI-PAK 21 TAB) 10 MG (21) TBPK tablet Take by mouth daily. Take 6 tabs by mouth daily  for 2 days, then 5 tabs for 2 days, then 4 tabs for 2 days, then 3 tabs for 2 days, 2 tabs for 2 days, then 1 tab by mouth daily for 2 days 12/16/22   Valinda Hoar, NP    Family History History reviewed. No pertinent family history.  Social History Social History   Tobacco Use   Smoking status: Former  Substance Use Topics   Alcohol use: Yes   Drug use: No     Allergies   Patient has no known allergies.   Review of Systems Review of Systems  Eyes:  Positive for pain.     Physical Exam Triage Vital  Signs ED Triage Vitals [02/07/23 1806]  Enc Vitals Group     BP (!) 159/95     Pulse Rate 74     Resp 18     Temp 98.6 F (37 C)     Temp Source Oral     SpO2 96 %     Weight      Height      Head Circumference      Peak Flow      Pain Score 7     Pain Loc      Pain Edu?      Excl. in [place]?    No data found.  Updated Vital Signs BP (!) 159/95 (BP Location: Right Arm)   Pulse 74   Temp 98.6 F (37 C) (Oral)   Resp 18   SpO2 96%   Visual Acuity Right Eye Distance:   Left Eye Distance:   Bilateral Distance:    Right Eye Near:   Left Eye Near:    Bilateral Near:     Physical Exam Vitals reviewed.  Constitutional:      Appearance: Normal appearance.  Skin:    General: Skin is warm and dry.  Neurological:     General: No focal deficit present.  Mental Status: He is alert and oriented to person, place, and time.  Psychiatric:        Mood and Affect: Mood normal.        Behavior: Behavior normal.      UC Treatments / Results  Labs (all labs ordered are listed, but only abnormal results are displayed) Labs Reviewed - No data to display  EKG   Radiology No results found.  Procedures Procedures (including critical care time)  Medications Ordered in UC Medications - No data to display  Initial Impression / Assessment and Plan / UC Course  I have reviewed the triage vital signs and the nursing notes.  Pertinent labs & imaging results that were available during my care of the patient were reviewed by me and considered in my medical decision making (see chart for details).   No detailed physical exam was performed during today's visit.  I informed the patient that we had no treatment to offer in urgent care.  His options were to go to the emergency room and hope to see an ophthalmologist or to wait until his appointment in 1 week but I could not treat diseases of the eye other than superficial infection.   Final Clinical Impressions(s) / UC Diagnoses    Final diagnoses:  None   Discharge Instructions   None    ED Prescriptions   None    PDMP not reviewed this encounter.   Charma Igo, [place] 02/07/23 1842

## 2023-02-07 NOTE — Discharge Instructions (Signed)
Please follow-up with your scheduled ophthalmology appointment or go to the ED and request to see an ophthalmologist.

## 2023-02-07 NOTE — ED Triage Notes (Signed)
Pt states he is having pressure and pain in right eye was seen here on 3/15 and followed up with eye doctor and was prescribed steroid eye drops but pt states it is not helping.

## 2023-02-08 ENCOUNTER — Emergency Department: Payer: BC Managed Care – PPO

## 2023-02-08 ENCOUNTER — Encounter: Payer: Self-pay | Admitting: Emergency Medicine

## 2023-02-08 ENCOUNTER — Emergency Department
Admission: EM | Admit: 2023-02-08 | Discharge: 2023-02-08 | Disposition: A | Discharge status: Home / Self Care | Payer: BC Managed Care – PPO | Attending: Emergency Medicine | Admitting: Emergency Medicine

## 2023-02-08 ENCOUNTER — Other Ambulatory Visit: Payer: Self-pay

## 2023-02-08 DIAGNOSIS — H5711 Ocular pain, right eye: Secondary | ICD-10-CM | POA: Insufficient documentation

## 2023-02-08 DIAGNOSIS — R519 Headache, unspecified: Secondary | ICD-10-CM | POA: Diagnosis not present

## 2023-02-08 DIAGNOSIS — R110 Nausea: Secondary | ICD-10-CM | POA: Diagnosis not present

## 2023-02-08 DIAGNOSIS — H5710 Ocular pain, unspecified eye: Secondary | ICD-10-CM

## 2023-02-08 MED ORDER — ACETAMINOPHEN 325 MG PO TABS
650.0000 mg | ORAL_TABLET | Freq: Once | ORAL | Status: AC
  Administered 2023-02-08: 650 mg via ORAL
  Filled 2023-02-08: qty 2

## 2023-02-08 MED ORDER — KETOROLAC TROMETHAMINE 15 MG/ML IJ SOLN
15.0000 mg | Freq: Once | INTRAMUSCULAR | Status: AC
  Administered 2023-02-08: 15 mg via INTRAMUSCULAR
  Filled 2023-02-08: qty 1

## 2023-02-08 MED ORDER — PROCHLORPERAZINE MALEATE 10 MG PO TABS: 10.0000 mg | ORAL_TABLET | Freq: Four times a day (QID) | ORAL | 0 refills | Status: AC | PRN

## 2023-02-08 MED ORDER — DEXAMETHASONE SODIUM PHOSPHATE 10 MG/ML IJ SOLN
10.0000 mg | Freq: Once | INTRAMUSCULAR | Status: AC
  Administered 2023-02-08: 10 mg via INTRAMUSCULAR
  Filled 2023-02-08: qty 1

## 2023-02-08 MED ORDER — ACETAMINOPHEN 500 MG PO TABS: 1000.0000 mg | ORAL_TABLET | Freq: Four times a day (QID) | ORAL | 0 refills | Status: AC | PRN

## 2023-02-08 NOTE — ED Provider Notes (Signed)
[facility] Emergency Department Provider Note     Event Date/Time   First MD Initiated Contact with Patient 02/08/23 1512     (approximate)   History   Migraine   HPI  William Patterson is a 42 y.o. male with no significant past medical history who presents to the emergency department with right eye pain for 2 months.  Patient describes this pain as a unilateral pressure behind his right eye with radiation to his head. Associated symptoms includes headache and tearing. Patient reports oral steroids are the only medication to significantly improve his pain. He was given steroid eye drops at his previous visit with ophthalmology, but reports "they do nothing". He has a follow up appointment on Tuesday 05/14 with opthalmology.  He was seen at urgent care yesterday. He would like something for his pain through the weekend so he is able to go to work. He endorses diplopia and nausea.  He denies vomiting, head trauma and history of migraines.     Physical Exam   Triage Vital Signs: ED Triage Vitals  Enc Vitals Group     BP 02/08/23 1431 (!) 135/99     Pulse Rate 02/08/23 1431 79     Resp 02/08/23 1431 18     Temp 02/08/23 1431 98.3 F (36.8 C)     Temp Source 02/08/23 1431 Oral     SpO2 02/08/23 1431 95 %     Weight --      Height --      Head Circumference --      Peak Flow --      Pain Score 02/08/23 1430 8     Pain Loc --      Pain Edu? --      Excl. in GC? --     Most recent vital signs: Vitals:   02/08/23 1431  BP: (!) 135/99  Pulse: 79  Resp: 18  Temp: 98.3 F (36.8 C)  SpO2: 95%   General: Frustrated. Alert and oriented. INAD.  Speech normal.   Head:  NCAT.  Eyes:  PERRLA. EOMI. Conjunctivae clear. Ears:  EACs patent w/o inflammation. TMs translucent. No bulging, erythema, or effusion.  Neck:   Supple. No cervical spine tenderness to palpation. No lymphadenopathy.  CV:  Good peripheral perfusion. RRR.  RESP:  Normal effort.   ABD:  No  distention.  BACK:  Spinous process is midline without deformity or tenderness.  MSK:   Patient freely moves all extremities. No warm swollen joints.  NEURO: Cranial nerves II-XII intact. No focal deficits. Sensation and motor function intact.       ED Results / Procedures / Treatments   Labs (all labs ordered are listed, but only abnormal results are displayed) Labs Reviewed - No data to display  RADIOLOGY  I personally viewed and evaluated these images as part of my medical decision making, as well as reviewing the written report by the radiologist.  ED Provider Interpretation: CT scan of head is unremarkable.  CT Head Wo Contrast  Result Date: 02/08/2023 CLINICAL DATA:  Headache, increasing frequency or severity. EXAM: CT HEAD WITHOUT CONTRAST TECHNIQUE: Contiguous axial images were obtained from the base of the skull through the vertex without intravenous contrast. RADIATION DOSE REDUCTION: This exam was performed according to the departmental dose-optimization program which includes automated exposure control, adjustment of the mA and/or kV according to patient size and/or use of iterative reconstruction technique. COMPARISON:  CT orbits 02/26/2005. FINDINGS: Brain: No acute  intracranial hemorrhage. Gray-white differentiation is preserved. No hydrocephalus or extra-axial collection. No mass effect or midline shift. Vascular: No hyperdense vessel or unexpected calcification. Skull: No calvarial fracture or suspicious bone lesion. Skull base is unremarkable. Sinuses/Orbits: Unremarkable. 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 IMPRESSION: No acute intracranial abnormality. Electronically Signed   By: Orvan Falconer M.D.   On: 02/08/2023 16:30     PROCEDURES:  Critical Care performed: No  Procedures   MEDICATIONS ORDERED IN ED: Medications  ketorolac (TORADOL) 15 MG/ML injection 15 mg (15 mg Intramuscular Given 02/08/23 1630)  dexamethasone (DECADRON) injection  10 mg (10 mg Intramuscular Given 02/08/23 1628)  acetaminophen (TYLENOL) tablet 650 mg (650 mg Oral Given 02/08/23 1626)     IMPRESSION / MDM / ASSESSMENT AND PLAN / ED COURSE  I reviewed the triage vital signs and the nursing notes.                              Differential diagnosis includes, but is not limited to, glaucoma, cluster headache, intracranial hemorrhage, previous head trauma,, migraine or migraine equivalent, idiopathic intracranial hypertension.  Patient's presentation is most consistent with acute complicated illness / injury requiring diagnostic workup.  42 year old male presents to the emergency department with continuing pressure behind his right eye with failure of treatment.  Primary assessment and reassuring.  Vital signs are within normal limits with the exception of mildly elevated blood pressure, however this may be patient's baseline due to trend.  Tonometer IOP = 18 reassuring  CT scan reassuring.  Patient is given Tylenol, Toradol, and steroid injection pain.  Reassessment is.  Patient reports significant improvement from initial presentation.  I consulted with Dr. Larinda Buttery for work further recommendations on management.  His recommendations included outpatient pain and medication to help with symptoms up until ophthalmologist. Patient's diagnosis is consistent with ocular pain. Patient will be discharged home with prescriptions for acetaminophen and Compazine. Patient is to follow up with [facility] for follow-up appointment on Tuesday, 05/14.  Patient is given ED precautions to return to the ED for any worsening or new symptoms.  Patient verbalized understanding and agrees with assessment and treatment plan.     FINAL CLINICAL IMPRESSION(S) / ED DIAGNOSES   Final diagnoses:  Ocular pain, unspecified laterality  Bad headache     Rx / DC Orders   ED Discharge Orders          Ordered    prochlorperazine (COMPAZINE) 10 MG tablet  Every 6 hours PRN         02/08/23 1759    acetaminophen (TYLENOL) 500 MG tablet  Every 6 hours PRN        02/08/23 1759             Note:  This document was prepared using Dragon voice recognition software and may include unintentional dictation errors.    Romeo Apple, Darden Flemister A, PA-C 02/09/23 1610    Chesley Noon, MD 02/11/23 [phone]

## 2023-02-08 NOTE — Discharge Instructions (Signed)
Being treated in the ER is only one step in your treatment and safety! Even if you feel better, you still need to go to all suggested follow-up appointments and take medications as directed. This will help you recover and prevent future problems. Make sure to make and go to all appointments, and call your doctor if things are not going as expected.  Take over-the-counter Tylenol or Ibuprofen with food or snacks as needed for pain.Take care and be patient!

## 2023-02-08 NOTE — ED Triage Notes (Signed)
Patient to William Patterson via POV for migraine. Patient states the migraine has been ongoing for the past 2 months. Prescribed prednisone but not helping.

## 2023-02-14 ENCOUNTER — Ambulatory Visit
Admission: RE | Admit: 2023-02-14 | Discharge: 2023-02-14 | Disposition: A | Discharge status: Home / Self Care | Payer: BC Managed Care – PPO | Source: Ambulatory Visit | Attending: Ophthalmology | Admitting: Ophthalmology

## 2023-02-14 ENCOUNTER — Other Ambulatory Visit: Payer: Self-pay | Admitting: Ophthalmology

## 2023-02-14 ENCOUNTER — Other Ambulatory Visit: Admission: RE | Admit: 2023-02-14 | Payer: BC Managed Care – PPO | Source: Ambulatory Visit | Admitting: Ophthalmology

## 2023-02-14 ENCOUNTER — Other Ambulatory Visit
Admission: RE | Admit: 2023-02-14 | Discharge: 2023-02-14 | Disposition: A | Discharge status: Home / Self Care | Payer: BC Managed Care – PPO | Source: Ambulatory Visit | Attending: Ophthalmology | Admitting: Ophthalmology

## 2023-02-14 DIAGNOSIS — H547 Unspecified visual loss: Secondary | ICD-10-CM

## 2023-02-14 DIAGNOSIS — H0520 Unspecified exophthalmos: Secondary | ICD-10-CM

## 2023-02-14 LAB — CBC WITH DIFFERENTIAL/PLATELET
Abs Immature Granulocytes: 0.02 10*3/uL (ref 0.00–0.07)
Basophils Absolute: 0 10*3/uL (ref 0.0–0.1)
Basophils Relative: 0 %
Eosinophils Absolute: 0 10*3/uL (ref 0.0–0.5)
Eosinophils Relative: 0 %
HCT: 51.9 % (ref 39.0–52.0)
Hemoglobin: 17.9 g/dL — ABNORMAL HIGH (ref 13.0–17.0)
Immature Granulocytes: 0 %
Lymphocytes Relative: 25 %
Lymphs Abs: 1.2 10*3/uL (ref 0.7–4.0)
MCH: 31.1 pg (ref 26.0–34.0)
MCHC: 34.5 g/dL (ref 30.0–36.0)
MCV: 90.3 fL (ref 80.0–100.0)
Monocytes Absolute: 0.1 10*3/uL (ref 0.1–1.0)
Monocytes Relative: 3 %
Neutro Abs: 3.4 10*3/uL (ref 1.7–7.7)
Neutrophils Relative %: 72 %
Platelets: 181 10*3/uL (ref 150–400)
RBC: 5.75 MIL/uL (ref 4.22–5.81)
RDW: 15.6 % — ABNORMAL HIGH (ref 11.5–15.5)
WBC: 4.7 10*3/uL (ref 4.0–10.5)
nRBC: 0 % (ref 0.0–0.2)

## 2023-02-14 LAB — SEDIMENTATION RATE: Sed Rate: 3 mm/hr (ref 0–15)

## 2023-02-14 MED ORDER — GADOBUTROL 1 MMOL/ML IV SOLN
7.0000 mL | Freq: Once | INTRAVENOUS | Status: AC | PRN
  Administered 2023-02-14: 7 mL via INTRAVENOUS

## 2023-02-15 LAB — C-REACTIVE PROTEIN: CRP: <0.5 mg/dL (ref <1.0)

## 2023-02-16 ENCOUNTER — Other Ambulatory Visit
Admission: RE | Admit: 2023-02-16 | Discharge: 2023-02-16 | Disposition: A | Discharge status: Home / Self Care | Payer: BC Managed Care – PPO | Source: Ambulatory Visit | Attending: Oculoplastics Ophthalmology | Admitting: Oculoplastics Ophthalmology

## 2023-02-16 DIAGNOSIS — H0510 Unspecified chronic inflammatory disorders of orbit: Secondary | ICD-10-CM | POA: Insufficient documentation

## 2023-02-16 LAB — SEDIMENTATION RATE: Sed Rate: 2 mm/hr (ref 0–15)

## 2023-02-17 LAB — ANCA PROFILE
Anti-MPO Antibodies: <0.2 units (ref 0.0–0.9)
Anti-PR3 Antibodies: <0.2 units (ref 0.0–0.9)
Atypical P-ANCA titer: <1:20 {titer}
C-ANCA: <1:20 {titer}
P-ANCA: <1:20 {titer}

## 2023-02-17 LAB — C-REACTIVE PROTEIN: CRP: 0.6 mg/dL (ref <1.0)

## 2023-02-17 LAB — ANA: Anti Nuclear Antibody (ANA): NEGATIVE

## 2023-02-17 LAB — THYROID STIMULATING IMMUNOGLOBULIN: Thyroid Stimulating Immunoglob: <0.1 IU/L (ref 0.00–0.55)

## 2023-02-17 LAB — RHEUMATOID FACTOR: Rheumatoid fact SerPl-aCnc: <10 IU/mL (ref <14.0)
